# Patient Record
Sex: Male | Born: 1935 | Race: White | Hispanic: No | Marital: Married
Health system: Southern US, Community
[De-identification: ages and names within clinical notes are randomized; demographics above are authoritative.]

---

## 2004-08-16 ENCOUNTER — Ambulatory Visit: Payer: Self-pay | Admitting: Gastroenterology

## 2005-09-25 ENCOUNTER — Ambulatory Visit: Payer: Self-pay | Admitting: Ophthalmology

## 2005-11-06 ENCOUNTER — Ambulatory Visit: Payer: Self-pay | Admitting: Ophthalmology

## 2008-05-12 ENCOUNTER — Ambulatory Visit: Payer: Self-pay | Admitting: Oncology

## 2008-05-27 ENCOUNTER — Ambulatory Visit: Payer: Self-pay | Admitting: Oncology

## 2008-06-12 ENCOUNTER — Ambulatory Visit: Payer: Self-pay | Admitting: Oncology

## 2008-07-13 ENCOUNTER — Ambulatory Visit: Payer: Self-pay | Admitting: Oncology

## 2012-02-16 ENCOUNTER — Emergency Department: Payer: Self-pay | Admitting: Emergency Medicine

## 2012-02-16 LAB — TROPONIN I: Troponin-I: <0.02 ng/mL

## 2012-02-16 LAB — COMPREHENSIVE METABOLIC PANEL
Albumin: 3.5 g/dL (ref 3.4–5.0)
Anion Gap: 8 (ref 7–16)
BUN: 21 mg/dL — ABNORMAL HIGH (ref 7–18)
Bilirubin,Total: 1.1 mg/dL — ABNORMAL HIGH (ref 0.2–1.0)
Chloride: 104 mmol/L (ref 98–107)
Creatinine: 0.74 mg/dL (ref 0.60–1.30)
EGFR (African American): >60
Osmolality: 281 (ref 275–301)
Potassium: 4.3 mmol/L (ref 3.5–5.1)
SGOT(AST): 38 U/L — ABNORMAL HIGH (ref 15–37)
SGPT (ALT): 26 U/L (ref 12–78)
Sodium: 139 mmol/L (ref 136–145)
Total Protein: 7.5 g/dL (ref 6.4–8.2)

## 2012-02-16 LAB — URINALYSIS, COMPLETE
Bilirubin,UR: NEGATIVE
Glucose,UR: NEGATIVE mg/dL (ref 0–75)
Nitrite: NEGATIVE
RBC,UR: 5 /HPF (ref 0–5)
WBC UR: 1 /HPF (ref 0–5)

## 2012-02-16 LAB — CBC
HCT: 37 % — ABNORMAL LOW (ref 40.0–52.0)
MCV: 101 fL — ABNORMAL HIGH (ref 80–100)
WBC: 8.9 10*3/uL (ref 3.8–10.6)

## 2012-02-16 LAB — CK TOTAL AND CKMB (NOT AT ARMC): CK-MB: 1.2 ng/mL (ref 0.5–3.6)

## 2012-02-29 ENCOUNTER — Inpatient Hospital Stay: Payer: Self-pay | Admitting: Internal Medicine

## 2012-02-29 LAB — COMPREHENSIVE METABOLIC PANEL
Albumin: 3.4 g/dL (ref 3.4–5.0)
Alkaline Phosphatase: 125 U/L (ref 50–136)
Anion Gap: 7 (ref 7–16)
BUN: 20 mg/dL — ABNORMAL HIGH (ref 7–18)
Bilirubin,Total: 0.7 mg/dL (ref 0.2–1.0)
Calcium, Total: 9.1 mg/dL (ref 8.5–10.1)
EGFR (African American): >60
EGFR (Non-African Amer.): >60
Glucose: 126 mg/dL — ABNORMAL HIGH (ref 65–99)
Osmolality: 291 (ref 275–301)
Potassium: 3.7 mmol/L (ref 3.5–5.1)
SGOT(AST): 23 U/L (ref 15–37)
SGPT (ALT): 22 U/L (ref 12–78)
Sodium: 144 mmol/L (ref 136–145)

## 2012-02-29 LAB — CBC
HCT: 34.6 % — ABNORMAL LOW (ref 40.0–52.0)
HGB: 11.7 g/dL — ABNORMAL LOW (ref 13.0–18.0)
MCH: 33.8 pg (ref 26.0–34.0)
MCHC: 34 g/dL (ref 32.0–36.0)
MCV: 100 fL (ref 80–100)
RBC: 3.47 10*6/uL — ABNORMAL LOW (ref 4.40–5.90)

## 2012-02-29 LAB — URINALYSIS, COMPLETE
Bacteria: NONE SEEN
Bilirubin,UR: NEGATIVE
Glucose,UR: NEGATIVE mg/dL (ref 0–75)
Leukocyte Esterase: NEGATIVE
Nitrite: NEGATIVE
Protein: NEGATIVE
Squamous Epithelial: <1
WBC UR: <1 /HPF (ref 0–5)

## 2012-02-29 LAB — TROPONIN I: Troponin-I: <0.02 ng/mL

## 2012-03-01 LAB — CBC WITH DIFFERENTIAL/PLATELET
Basophil %: 0.6 %
Eosinophil %: 3.1 %
HGB: 11.4 g/dL — ABNORMAL LOW (ref 13.0–18.0)
Lymphocyte #: 1.4 10*3/uL (ref 1.0–3.6)
Lymphocyte %: 23.1 %
MCV: 100 fL (ref 80–100)
Monocyte #: 0.6 x10 3/mm (ref 0.2–1.0)
Monocyte %: 10.7 %
Neutrophil %: 62.5 %
Platelet: 165 10*3/uL (ref 150–440)
RBC: 3.36 10*6/uL — ABNORMAL LOW (ref 4.40–5.90)
WBC: 5.9 10*3/uL (ref 3.8–10.6)

## 2012-03-01 LAB — TSH: Thyroid Stimulating Horm: 5.63 u[IU]/mL — ABNORMAL HIGH

## 2012-03-01 LAB — COMPREHENSIVE METABOLIC PANEL
Anion Gap: 8 (ref 7–16)
BUN: 14 mg/dL (ref 7–18)
Calcium, Total: 8.6 mg/dL (ref 8.5–10.1)
Chloride: 109 mmol/L — ABNORMAL HIGH (ref 98–107)
Co2: 28 mmol/L (ref 21–32)
EGFR (African American): >60
EGFR (Non-African Amer.): >60
Glucose: 66 mg/dL (ref 65–99)
SGOT(AST): 22 U/L (ref 15–37)
SGPT (ALT): 20 U/L (ref 12–78)

## 2012-03-02 LAB — CBC WITH DIFFERENTIAL/PLATELET
Basophil #: 0 10*3/uL (ref 0.0–0.1)
Eosinophil %: 3.7 %
HCT: 29.8 % — ABNORMAL LOW (ref 40.0–52.0)
HGB: 10.1 g/dL — ABNORMAL LOW (ref 13.0–18.0)
Lymphocyte #: 1.1 10*3/uL (ref 1.0–3.6)
MCH: 33.7 pg (ref 26.0–34.0)
MCHC: 34 g/dL (ref 32.0–36.0)
MCV: 99 fL (ref 80–100)
Monocyte #: 0.5 x10 3/mm (ref 0.2–1.0)
Monocyte %: 10.4 %
Neutrophil #: 3.3 10*3/uL (ref 1.4–6.5)
RDW: 13.4 % (ref 11.5–14.5)

## 2012-03-02 LAB — BASIC METABOLIC PANEL
Calcium, Total: 7.8 mg/dL — ABNORMAL LOW (ref 8.5–10.1)
Chloride: 111 mmol/L — ABNORMAL HIGH (ref 98–107)
Co2: 26 mmol/L (ref 21–32)
Creatinine: 0.76 mg/dL (ref 0.60–1.30)
EGFR (African American): >60
EGFR (Non-African Amer.): >60
Potassium: 3.3 mmol/L — ABNORMAL LOW (ref 3.5–5.1)
Sodium: 147 mmol/L — ABNORMAL HIGH (ref 136–145)

## 2012-03-03 LAB — BASIC METABOLIC PANEL
Anion Gap: 8 (ref 7–16)
Calcium, Total: 7.8 mg/dL — ABNORMAL LOW (ref 8.5–10.1)
Co2: 26 mmol/L (ref 21–32)
Creatinine: 0.75 mg/dL (ref 0.60–1.30)
EGFR (African American): >60
EGFR (Non-African Amer.): >60
Potassium: 3.9 mmol/L (ref 3.5–5.1)
Sodium: 145 mmol/L (ref 136–145)

## 2012-03-03 LAB — CBC WITH DIFFERENTIAL/PLATELET
Basophil %: 1.1 %
Eosinophil %: 4.1 %
HCT: 29.3 % — ABNORMAL LOW (ref 40.0–52.0)
HGB: 10.1 g/dL — ABNORMAL LOW (ref 13.0–18.0)
Lymphocyte #: 1.4 10*3/uL (ref 1.0–3.6)
Lymphocyte %: 23.5 %
MCHC: 34.4 g/dL (ref 32.0–36.0)
Monocyte #: 0.6 x10 3/mm (ref 0.2–1.0)
Monocyte %: 9.8 %
Neutrophil #: 3.6 10*3/uL (ref 1.4–6.5)
Neutrophil %: 61.5 %
Platelet: 137 10*3/uL — ABNORMAL LOW (ref 150–440)
RBC: 2.97 10*6/uL — ABNORMAL LOW (ref 4.40–5.90)
WBC: 5.8 10*3/uL (ref 3.8–10.6)

## 2012-03-14 ENCOUNTER — Ambulatory Visit: Payer: Self-pay | Admitting: Internal Medicine

## 2012-03-14 ENCOUNTER — Inpatient Hospital Stay: Payer: Self-pay | Admitting: Internal Medicine

## 2012-03-14 LAB — COMPREHENSIVE METABOLIC PANEL
Albumin: 2.2 g/dL — ABNORMAL LOW (ref 3.4–5.0)
Anion Gap: 11 (ref 7–16)
Calcium, Total: 8.3 mg/dL — ABNORMAL LOW (ref 8.5–10.1)
Chloride: 111 mmol/L — ABNORMAL HIGH (ref 98–107)
Co2: 26 mmol/L (ref 21–32)
Creatinine: 1.29 mg/dL (ref 0.60–1.30)
EGFR (African American): 57 — ABNORMAL LOW
EGFR (Non-African Amer.): 50 — ABNORMAL LOW
Osmolality: 298 (ref 275–301)
Potassium: 3.5 mmol/L (ref 3.5–5.1)

## 2012-03-14 LAB — PROTIME-INR
INR: 1.5
Prothrombin Time: 18.7 secs — ABNORMAL HIGH (ref 11.5–14.7)

## 2012-03-14 LAB — CBC WITH DIFFERENTIAL/PLATELET
Bands: 33 %
Comment - H1-Com1: NORMAL
HCT: 34.1 % — ABNORMAL LOW (ref 40.0–52.0)
HGB: 11.4 g/dL — ABNORMAL LOW (ref 13.0–18.0)
Lymphocytes: 6 %
MCH: 33.4 pg (ref 26.0–34.0)
MCHC: 33.6 g/dL (ref 32.0–36.0)
Segmented Neutrophils: 58 %
WBC: 7.4 10*3/uL (ref 3.8–10.6)

## 2012-03-15 LAB — BASIC METABOLIC PANEL
Chloride: 111 mmol/L — ABNORMAL HIGH (ref 98–107)
EGFR (African American): >60
EGFR (Non-African Amer.): >60
Glucose: 152 mg/dL — ABNORMAL HIGH (ref 65–99)
Potassium: 3.5 mmol/L (ref 3.5–5.1)

## 2012-03-15 LAB — CBC WITH DIFFERENTIAL/PLATELET
Basophil #: 0 10*3/uL (ref 0.0–0.1)
Basophil %: 0.1 %
Eosinophil #: 0 10*3/uL (ref 0.0–0.7)
HGB: 9.9 g/dL — ABNORMAL LOW (ref 13.0–18.0)
Lymphocyte %: 2.1 %
MCH: 32.4 pg (ref 26.0–34.0)
MCHC: 32.4 g/dL (ref 32.0–36.0)
Monocyte #: 0.3 x10 3/mm (ref 0.2–1.0)
Monocyte %: 1.5 %
Neutrophil %: 96.3 %
Platelet: 182 10*3/uL (ref 150–440)
RDW: 14 % (ref 11.5–14.5)

## 2012-03-16 LAB — CBC WITH DIFFERENTIAL/PLATELET
Basophil #: 0 10*3/uL (ref 0.0–0.1)
Basophil %: 0.1 %
Eosinophil #: 0 10*3/uL (ref 0.0–0.7)
Eosinophil %: 0 %
HCT: 29.6 % — ABNORMAL LOW (ref 40.0–52.0)
HGB: 9.6 g/dL — ABNORMAL LOW (ref 13.0–18.0)
MCH: 32.4 pg (ref 26.0–34.0)
MCHC: 32.5 g/dL (ref 32.0–36.0)
MCV: 100 fL (ref 80–100)
Monocyte #: 0.4 x10 3/mm (ref 0.2–1.0)
Neutrophil %: 97 %
Platelet: 236 10*3/uL (ref 150–440)
RBC: 2.97 10*6/uL — ABNORMAL LOW (ref 4.40–5.90)
RDW: 14.1 % (ref 11.5–14.5)
WBC: 27 10*3/uL — ABNORMAL HIGH (ref 3.8–10.6)

## 2012-03-16 LAB — BASIC METABOLIC PANEL
Anion Gap: 12 (ref 7–16)
BUN: 30 mg/dL — ABNORMAL HIGH (ref 7–18)
Calcium, Total: 8.2 mg/dL — ABNORMAL LOW (ref 8.5–10.1)
Chloride: 111 mmol/L — ABNORMAL HIGH (ref 98–107)
Co2: 24 mmol/L (ref 21–32)
Creatinine: 0.89 mg/dL (ref 0.60–1.30)
EGFR (Non-African Amer.): >60
Sodium: 147 mmol/L — ABNORMAL HIGH (ref 136–145)

## 2012-03-17 LAB — CBC WITH DIFFERENTIAL/PLATELET
Basophil %: 0 %
Eosinophil #: 0 10*3/uL (ref 0.0–0.7)
Eosinophil %: 0 %
HGB: 8.3 g/dL — ABNORMAL LOW (ref 13.0–18.0)
Lymphocyte #: 0.4 10*3/uL — ABNORMAL LOW (ref 1.0–3.6)
Lymphocyte %: 2.2 %
MCH: 32.8 pg (ref 26.0–34.0)
Monocyte #: 0.3 x10 3/mm (ref 0.2–1.0)
Neutrophil %: 96.4 %
Platelet: 219 10*3/uL (ref 150–440)
RBC: 2.55 10*6/uL — ABNORMAL LOW (ref 4.40–5.90)
WBC: 18.5 10*3/uL — ABNORMAL HIGH (ref 3.8–10.6)

## 2012-03-17 LAB — CREATININE, SERUM
EGFR (African American): >60
EGFR (Non-African Amer.): >60

## 2012-03-18 LAB — VANCOMYCIN, TROUGH: Vancomycin, Trough: 16 ug/mL (ref 10–20)

## 2012-03-18 LAB — BASIC METABOLIC PANEL
Anion Gap: 11 (ref 7–16)
BUN: 35 mg/dL — ABNORMAL HIGH (ref 7–18)
Calcium, Total: 7.9 mg/dL — ABNORMAL LOW (ref 8.5–10.1)
Chloride: 109 mmol/L — ABNORMAL HIGH (ref 98–107)
Co2: 24 mmol/L (ref 21–32)
Creatinine: 0.92 mg/dL (ref 0.60–1.30)
EGFR (African American): >60
Osmolality: 297 (ref 275–301)

## 2012-03-18 LAB — TRIGLYCERIDES: Triglycerides: 127 mg/dL (ref 0–200)

## 2012-03-19 LAB — CBC WITH DIFFERENTIAL/PLATELET
Basophil #: 0 10*3/uL (ref 0.0–0.1)
Basophil %: 0.1 %
Eosinophil #: 0 10*3/uL (ref 0.0–0.7)
Lymphocyte #: 0.3 10*3/uL — ABNORMAL LOW (ref 1.0–3.6)
MCH: 33.1 pg (ref 26.0–34.0)
MCHC: 33.6 g/dL (ref 32.0–36.0)
MCV: 99 fL (ref 80–100)
Monocyte #: 0.3 x10 3/mm (ref 0.2–1.0)
Neutrophil %: 94.9 %
Platelet: 254 10*3/uL (ref 150–440)
RDW: 13.8 % (ref 11.5–14.5)

## 2012-03-19 LAB — BASIC METABOLIC PANEL
BUN: 33 mg/dL — ABNORMAL HIGH (ref 7–18)
Chloride: 109 mmol/L — ABNORMAL HIGH (ref 98–107)
Co2: 24 mmol/L (ref 21–32)
EGFR (African American): >60
EGFR (Non-African Amer.): >60
Osmolality: 297 (ref 275–301)
Potassium: 3.2 mmol/L — ABNORMAL LOW (ref 3.5–5.1)

## 2012-03-19 LAB — CULTURE, BLOOD (SINGLE)

## 2012-03-19 LAB — PHOSPHORUS: Phosphorus: 2.9 mg/dL (ref 2.5–4.9)

## 2012-03-20 LAB — BASIC METABOLIC PANEL
Anion Gap: 10 (ref 7–16)
Calcium, Total: 7.9 mg/dL — ABNORMAL LOW (ref 8.5–10.1)
Co2: 24 mmol/L (ref 21–32)
EGFR (African American): >60
EGFR (Non-African Amer.): >60
Osmolality: 291 (ref 275–301)
Sodium: 139 mmol/L (ref 136–145)

## 2012-03-20 LAB — CBC WITH DIFFERENTIAL/PLATELET
Basophil %: 0 %
Eosinophil #: 0 10*3/uL (ref 0.0–0.7)
HGB: 10 g/dL — ABNORMAL LOW (ref 13.0–18.0)
Lymphocyte %: 3.2 %
MCHC: 33.7 g/dL (ref 32.0–36.0)
Monocyte #: 0.4 x10 3/mm (ref 0.2–1.0)
Neutrophil %: 93.2 %
RBC: 3.02 10*6/uL — ABNORMAL LOW (ref 4.40–5.90)
WBC: 10.6 10*3/uL (ref 3.8–10.6)

## 2012-03-21 LAB — BASIC METABOLIC PANEL
BUN: 34 mg/dL — ABNORMAL HIGH (ref 7–18)
Calcium, Total: 7.6 mg/dL — ABNORMAL LOW (ref 8.5–10.1)
Co2: 25 mmol/L (ref 21–32)
Osmolality: 292 (ref 275–301)
Potassium: 3.6 mmol/L (ref 3.5–5.1)
Sodium: 139 mmol/L (ref 136–145)

## 2012-03-21 LAB — CBC WITH DIFFERENTIAL/PLATELET
Basophil %: 0.1 %
Eosinophil %: 0 %
HGB: 9.7 g/dL — ABNORMAL LOW (ref 13.0–18.0)
Lymphocyte #: 0.3 10*3/uL — ABNORMAL LOW (ref 1.0–3.6)
MCH: 34.6 pg — ABNORMAL HIGH (ref 26.0–34.0)
MCHC: 35.4 g/dL (ref 32.0–36.0)
Monocyte %: 4.5 %
Platelet: 232 10*3/uL (ref 150–440)
WBC: 8.5 10*3/uL (ref 3.8–10.6)

## 2012-03-22 LAB — CBC WITH DIFFERENTIAL/PLATELET
Basophil #: 0 10*3/uL (ref 0.0–0.1)
Eosinophil #: 0 10*3/uL (ref 0.0–0.7)
HCT: 28.6 % — ABNORMAL LOW (ref 40.0–52.0)
Lymphocyte #: 0.2 10*3/uL — ABNORMAL LOW (ref 1.0–3.6)
MCHC: 34.4 g/dL (ref 32.0–36.0)
MCV: 97 fL (ref 80–100)
Monocyte #: 0.4 x10 3/mm (ref 0.2–1.0)
Neutrophil #: 7.9 10*3/uL — ABNORMAL HIGH (ref 1.4–6.5)
Platelet: 225 10*3/uL (ref 150–440)
RDW: 14.1 % (ref 11.5–14.5)
WBC: 8.5 10*3/uL (ref 3.8–10.6)

## 2012-03-23 LAB — CBC WITH DIFFERENTIAL/PLATELET
Basophil #: 0 10*3/uL (ref 0.0–0.1)
Basophil %: 0.4 %
Eosinophil #: 0 10*3/uL (ref 0.0–0.7)
HCT: 28.8 % — ABNORMAL LOW (ref 40.0–52.0)
Lymphocyte #: 0.8 10*3/uL — ABNORMAL LOW (ref 1.0–3.6)
MCH: 33.6 pg (ref 26.0–34.0)
MCHC: 34.7 g/dL (ref 32.0–36.0)
Monocyte #: 0.6 x10 3/mm (ref 0.2–1.0)
Monocyte %: 6.6 %
Neutrophil %: 84 %
Platelet: 257 10*3/uL (ref 150–440)

## 2012-03-23 LAB — BASIC METABOLIC PANEL
Calcium, Total: 8 mg/dL — ABNORMAL LOW (ref 8.5–10.1)
Chloride: 107 mmol/L (ref 98–107)
Co2: 26 mmol/L (ref 21–32)
Creatinine: 0.74 mg/dL (ref 0.60–1.30)
EGFR (Non-African Amer.): >60
Osmolality: 292 (ref 275–301)
Potassium: 3.3 mmol/L — ABNORMAL LOW (ref 3.5–5.1)
Sodium: 142 mmol/L (ref 136–145)

## 2012-03-24 LAB — BASIC METABOLIC PANEL
Anion Gap: 10 (ref 7–16)
BUN: 34 mg/dL — ABNORMAL HIGH (ref 7–18)
Calcium, Total: 7.8 mg/dL — ABNORMAL LOW (ref 8.5–10.1)
Chloride: 112 mmol/L — ABNORMAL HIGH (ref 98–107)
Co2: 25 mmol/L (ref 21–32)
Creatinine: 0.72 mg/dL (ref 0.60–1.30)
EGFR (African American): >60
Osmolality: 302 (ref 275–301)

## 2012-03-24 LAB — CBC WITH DIFFERENTIAL/PLATELET
Basophil #: 0 10*3/uL (ref 0.0–0.1)
Basophil %: 0.2 %
Eosinophil %: 0 %
HCT: 28.2 % — ABNORMAL LOW (ref 40.0–52.0)
HGB: 9.5 g/dL — ABNORMAL LOW (ref 13.0–18.0)
Lymphocyte %: 6.4 %
MCHC: 33.7 g/dL (ref 32.0–36.0)
Monocyte %: 4.6 %
Neutrophil #: 7.3 10*3/uL — ABNORMAL HIGH (ref 1.4–6.5)
Neutrophil %: 88.8 %
Platelet: 241 10*3/uL (ref 150–440)
RBC: 2.9 10*6/uL — ABNORMAL LOW (ref 4.40–5.90)
RDW: 14.5 % (ref 11.5–14.5)
WBC: 8.2 10*3/uL (ref 3.8–10.6)

## 2012-03-25 LAB — BASIC METABOLIC PANEL
Anion Gap: 11 (ref 7–16)
BUN: 29 mg/dL — ABNORMAL HIGH (ref 7–18)
Creatinine: 0.63 mg/dL (ref 0.60–1.30)
EGFR (Non-African Amer.): >60
Glucose: 108 mg/dL — ABNORMAL HIGH (ref 65–99)
Potassium: 3.4 mmol/L — ABNORMAL LOW (ref 3.5–5.1)
Sodium: 149 mmol/L — ABNORMAL HIGH (ref 136–145)

## 2012-03-25 LAB — CBC WITH DIFFERENTIAL/PLATELET
Basophil %: 0.1 %
Eosinophil #: 0 10*3/uL (ref 0.0–0.7)
Eosinophil %: 0.1 %
HCT: 28.5 % — ABNORMAL LOW (ref 40.0–52.0)
HGB: 9.4 g/dL — ABNORMAL LOW (ref 13.0–18.0)
Lymphocyte #: 0.6 10*3/uL — ABNORMAL LOW (ref 1.0–3.6)
Lymphocyte %: 4.7 %
MCH: 32.2 pg (ref 26.0–34.0)
MCHC: 32.9 g/dL (ref 32.0–36.0)
MCV: 98 fL (ref 80–100)
Monocyte #: 0.6 x10 3/mm (ref 0.2–1.0)
Monocyte %: 4.7 %
Neutrophil #: 10.9 10*3/uL — ABNORMAL HIGH (ref 1.4–6.5)
Neutrophil %: 90.4 %
RDW: 14.8 % — ABNORMAL HIGH (ref 11.5–14.5)

## 2012-03-27 LAB — CBC WITH DIFFERENTIAL/PLATELET
Basophil %: 0.4 %
Eosinophil #: 0 10*3/uL (ref 0.0–0.7)
Eosinophil %: 0.2 %
HGB: 8.5 g/dL — ABNORMAL LOW (ref 13.0–18.0)
Lymphocyte %: 6.6 %
MCH: 33.9 pg (ref 26.0–34.0)
MCHC: 33.9 g/dL (ref 32.0–36.0)
MCV: 100 fL (ref 80–100)
Monocyte #: 0.6 x10 3/mm (ref 0.2–1.0)
Neutrophil %: 86.2 %
Platelet: 138 10*3/uL — ABNORMAL LOW (ref 150–440)
RBC: 2.51 10*6/uL — ABNORMAL LOW (ref 4.40–5.90)

## 2012-03-27 LAB — BASIC METABOLIC PANEL
Anion Gap: 9 (ref 7–16)
Calcium, Total: 7.6 mg/dL — ABNORMAL LOW (ref 8.5–10.1)
Co2: 25 mmol/L (ref 21–32)
Creatinine: 0.63 mg/dL (ref 0.60–1.30)
EGFR (African American): >60
Potassium: 3.9 mmol/L (ref 3.5–5.1)
Sodium: 155 mmol/L — ABNORMAL HIGH (ref 136–145)

## 2012-03-28 LAB — BASIC METABOLIC PANEL
Anion Gap: 9 (ref 7–16)
BUN: 22 mg/dL — ABNORMAL HIGH (ref 7–18)
Creatinine: 0.61 mg/dL (ref 0.60–1.30)
EGFR (African American): >60
EGFR (Non-African Amer.): >60
Glucose: 112 mg/dL — ABNORMAL HIGH (ref 65–99)
Sodium: 155 mmol/L — ABNORMAL HIGH (ref 136–145)

## 2012-04-12 ENCOUNTER — Ambulatory Visit: Payer: Self-pay | Admitting: Internal Medicine

## 2012-04-12 DEATH — deceased

## 2013-05-02 IMAGING — CR DG CHEST 1V PORT
1 series · 1 of 1 positions shown · non-contrast
Comparison: none

REASON FOR EXAM: fever, cough, hypoxia
COMMENTS:

[ap]
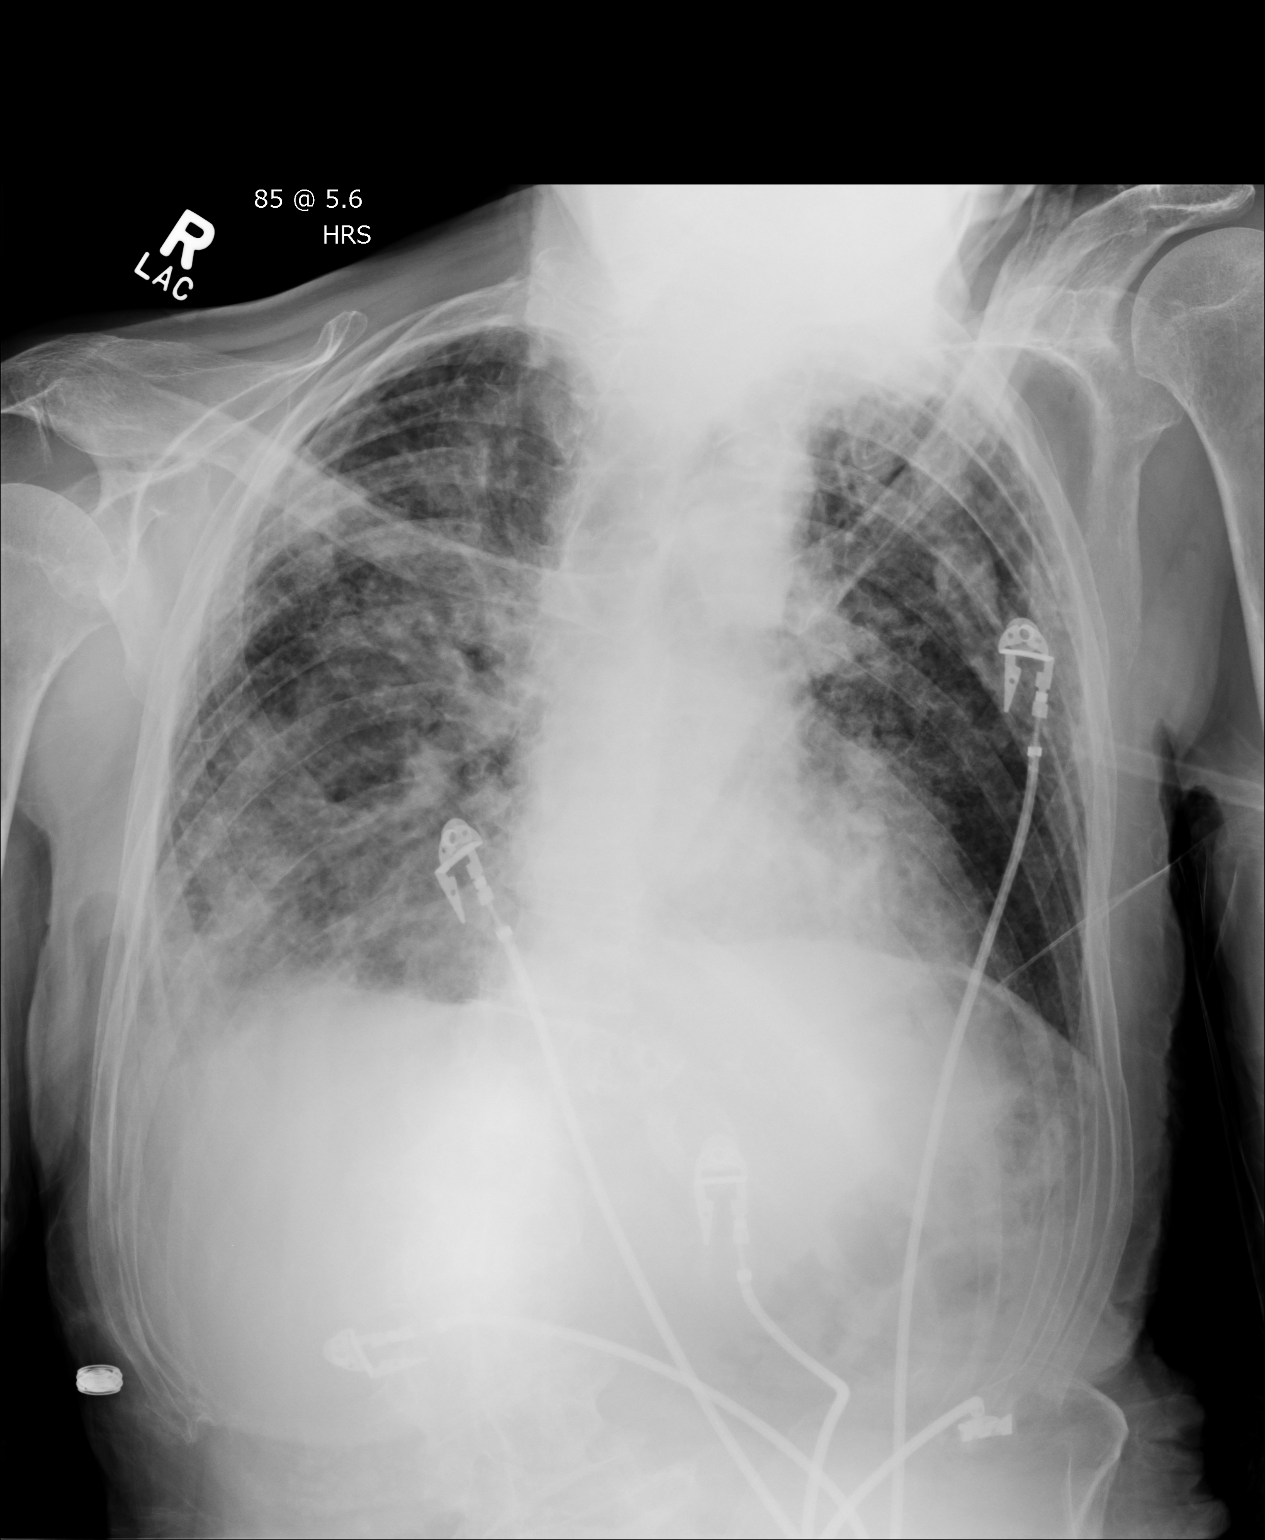

[1 of 1 positions shown; findings below may reference images not displayed]

PROCEDURE:     DXR - DXR PORTABLE CHEST SINGLE VIEW  - March 14, 2012  [DATE]

RESULT:     Comparison is made to the study 29 February, 2012.

The lungs are adequately inflated. Near confluent increased interstitial
markings are present on the right with milder increased lung markings on the
left. These findings are not new. The cardiac silhouette is not enlarged.
The pulmonary vascularity is indistinct. There may be a small amount of
pleural fluid on the right.
IMPRESSION: The findings are worrisome for bilateral interstitial
pneumonia or less likely interstitial edema. There is no overt evidence of
pulmonary vascular congestion or cardiac chamber enlargement. A followup PA
and lateral chest x-ray would be of value.

[REDACTED]

## 2013-05-03 IMAGING — CR DG CHEST 1V PORT
1 series · 1 of 1 positions shown · non-contrast
Comparison: none

REASON FOR EXAM: Respiratory Failure/Interval Changes
COMMENTS:

[portable]
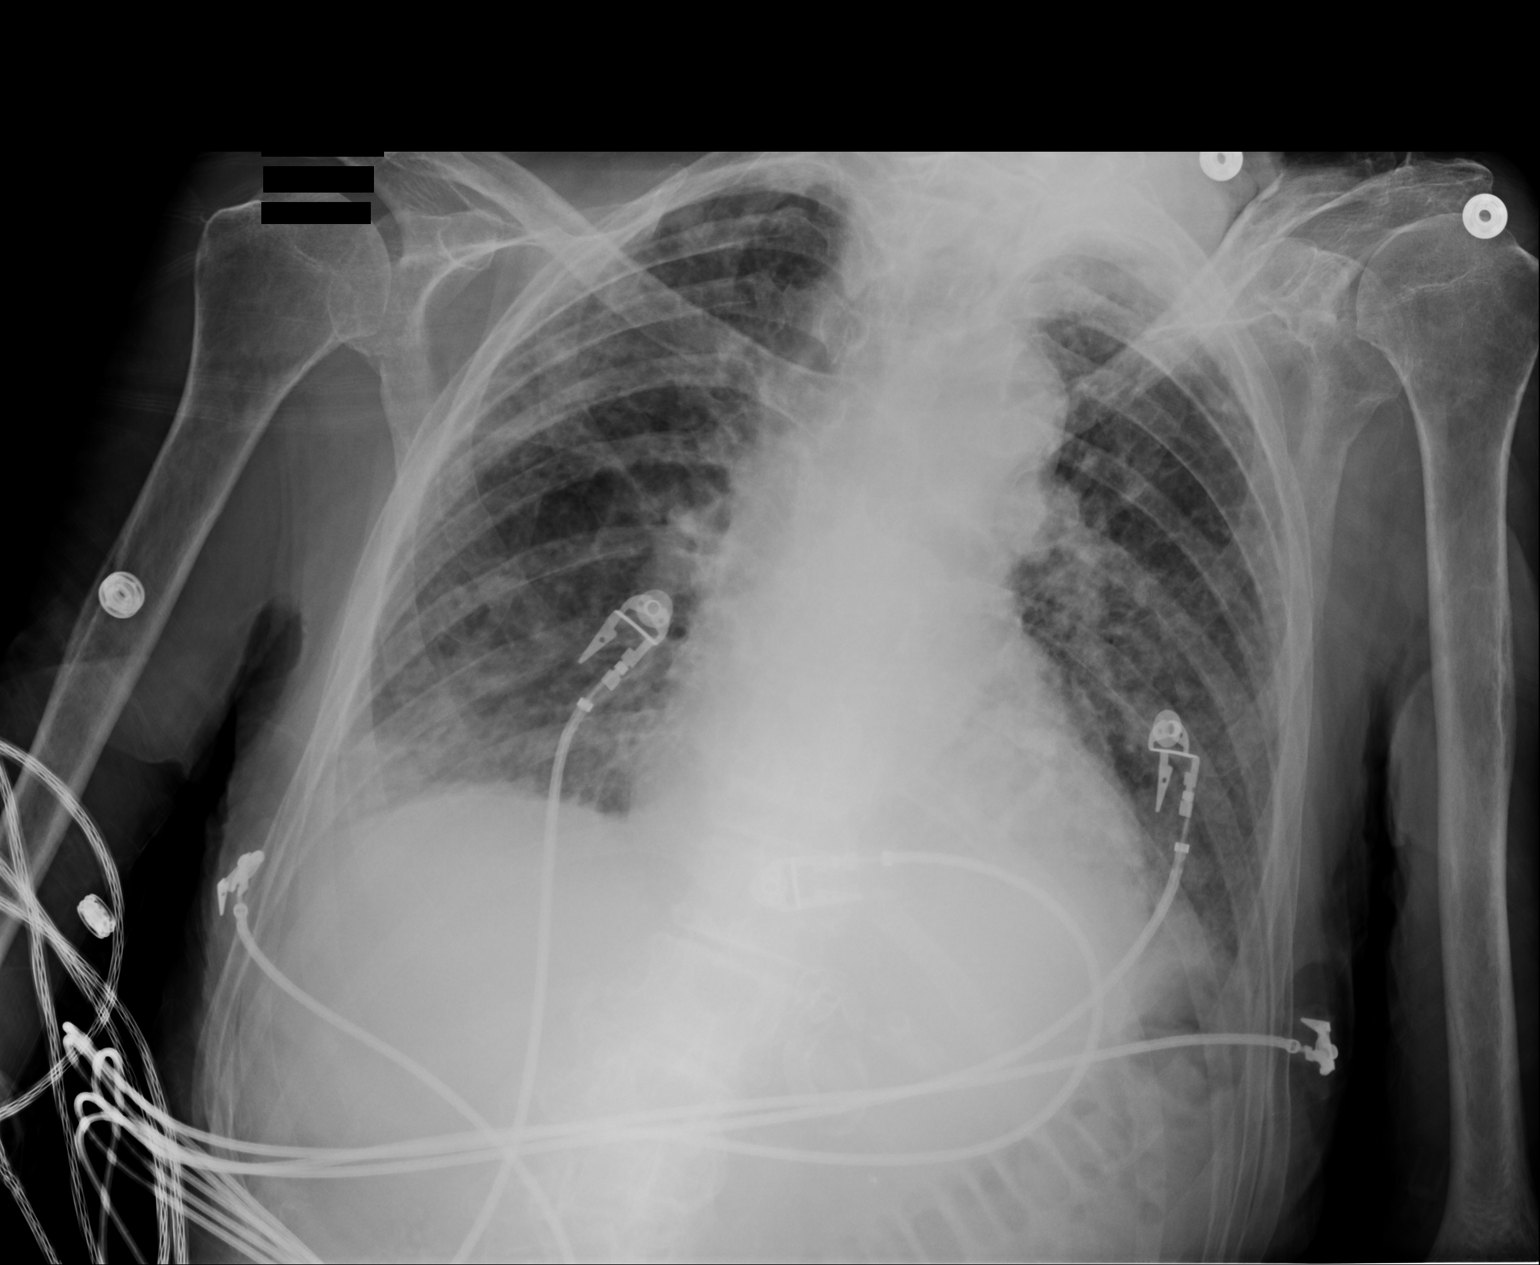

[1 of 1 positions shown; findings below may reference images not displayed]

PROCEDURE:     DXR - DXR PORTABLE CHEST SINGLE VIEW  - March 15, 2012  [DATE]

RESULT:     Comparison made to prior study of 03/14/2012 . There is
persistent bilateral interstitial prominence is slightly improved from prior
study. Mild cardiomegaly. Pulmonary vascularity is normal. These findings
suggest diffuse bilateral interstitial pneumonia. Interstitial edema cannot
be excluded.
IMPRESSION: Persistent bilateral  interstitial lung disease most likely
interstitial pneumonitis. These changes have improved slightly from
03/14/2012.

## 2013-05-04 IMAGING — CR DG CHEST 1V PORT
1 series · 1 of 1 positions shown · non-contrast
Comparison: none

REASON FOR EXAM: Respiratory Failure/Interval Changes
COMMENTS:

[ap]
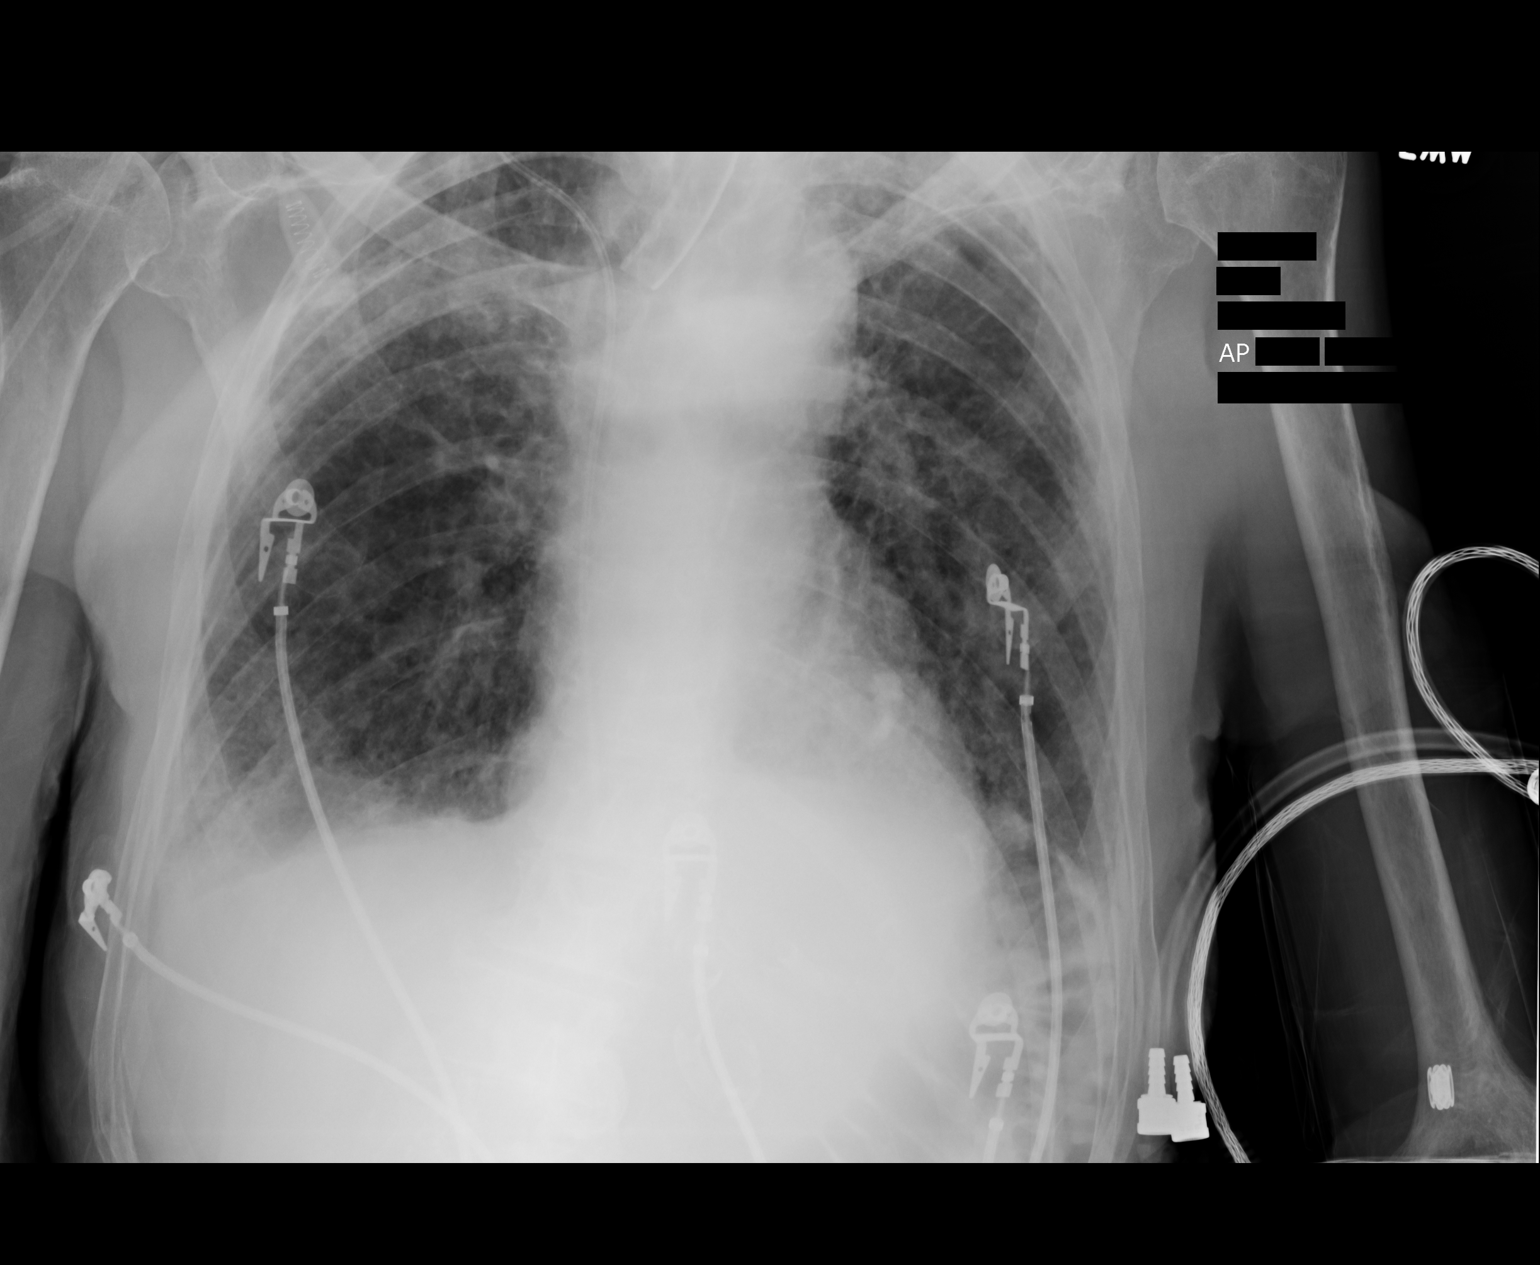

[1 of 1 positions shown; findings below may reference images not displayed]

PROCEDURE:     DXR - DXR PORTABLE CHEST SINGLE VIEW  - March 16, 2012  [DATE]

RESULT:     Comparison made to prior study 03/15/2012 and 02/29/2012. The
tracheostomy tube in good position. Central line good position. Bilateral
pulmonary interstitial changes again noted and are  unchanged. Similar
findings have been noted on multiple prior exams. Poor inspiratory effort
with atelectasis in lung bases. Left ventricular wall versus mitral annular
calcific noted. This is stable.
IMPRESSION: 1. Stable interstitial lung disease, possibly interstitial fibrosis.
2. Stable tube positions.

## 2013-05-06 IMAGING — CR DG CHEST 1V PORT
1 series · 1 of 1 positions shown · non-contrast
Comparison: none

REASON FOR EXAM: placement for et tube
COMMENTS:

PROCEDURE:     DXR - DXR PORTABLE CHEST SINGLE VIEW  - March 18, 2012  [DATE]
RESULT:     Comparison: 03/16/2012

[ap]
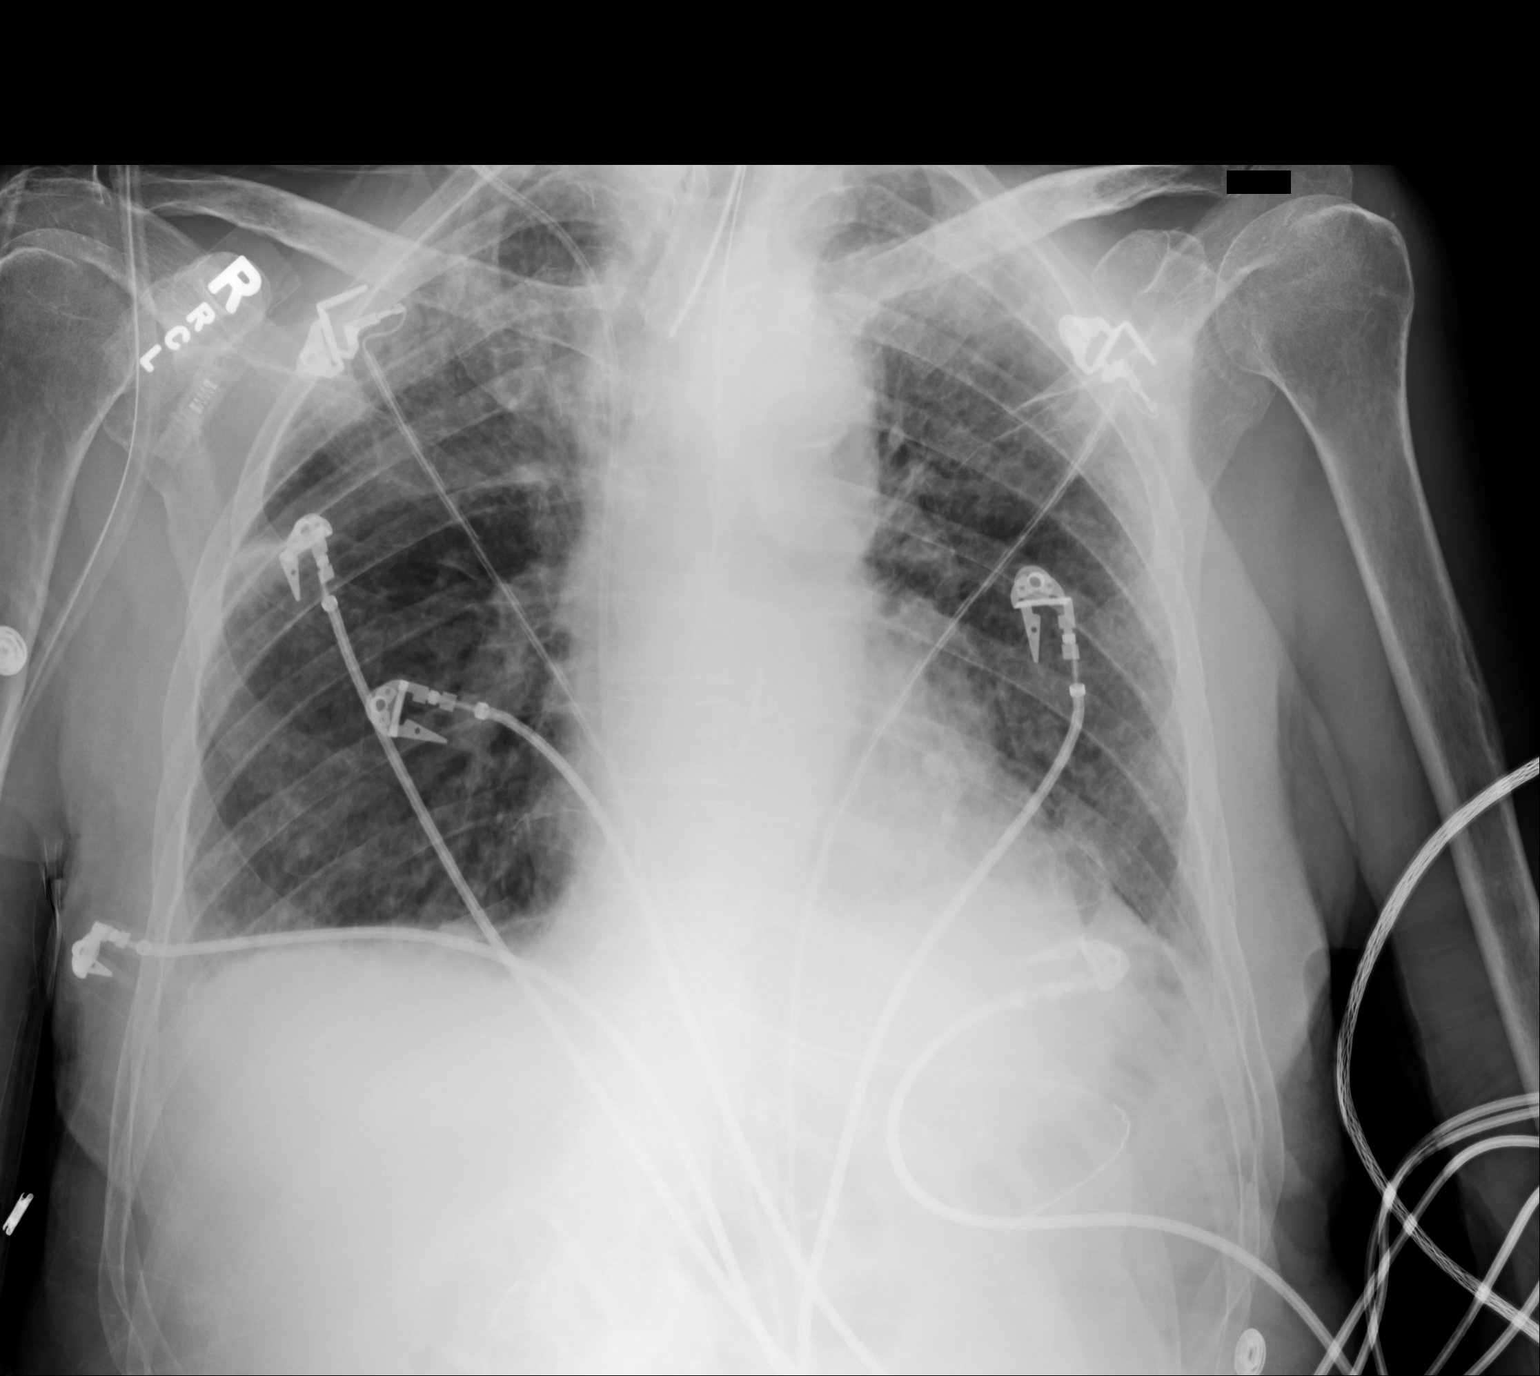

[1 of 1 positions shown; findings below may reference images not displayed]

FINDINGS: The endotracheal tube terminates at or just below the thoracic inlet. Right
IJ central venous catheter tip is difficult to evaluate secondary to the
overlying leads. However, it likely terminates overlying the right atrium.
Enteric tube terminates over the left upper quadrant. The heart and
mediastinal are stable. Bilateral heterogeneous interstitial opacities are
similar to prior.
IMPRESSION: 1. Endotracheal tube terminates at or just below the thoracic inlet.
2. Bilateral heterogeneous reticular opacities are similar to prior.

[REDACTED]

## 2014-09-29 NOTE — Consult Note (Signed)
Comments   I met with pt's wife, son and daughter. Updated them on pt's current medical condition. Explained that when pt passes breathing trial, we will extubate. Discussed at length what wife would choose for pt in the event he does not do well after extubation, eg reintubation vs comfort care. Wife stated that she herself would not want to be maintained on "life support" and doubts that her husband would either. However, family wants to discuss further among themselves and will get back to me.  expressed appreciation for meeting. All questions answered.   Electronic Signatures: Tiari Andringa, Izora Gala (MD)  (Signed 09-Oct-13 17:07)  Authored: Palliative Care   Last Updated: 09-Oct-13 17:07 by Ancel Easler, Izora Gala (MD)

## 2014-09-29 NOTE — Consult Note (Signed)
Brief Consult Note: Diagnosis: frequent falls, ? post concussion gait impairment?.   Patient was seen by consultant.   Consult note dictated.   Comments: very elderly, cachectic WM had severe fall with orbital fracture - had gait impirment since then. - no s/s of parkinsonism or appendicular ataxia. - can't rule out truncal ataxia - possible myelopathy with hyper-reflexia but will not pursue MR c-spine as he won't be a good candidate for any surgery. - agree with PT, home health. will follow PRN.  Electronic Signatures: Jolene ProvostShah, Ketra Duchesne Kalpeshkumar (MD)  (Signed 21-Sep-13 18:43)  Authored: Brief Consult Note   Last Updated: 21-Sep-13 18:43 by Jolene ProvostShah, Levenia Skalicky Kalpeshkumar (MD)

## 2014-09-29 NOTE — H&P (Signed)
PATIENT NAMCharletta Colon:  Mineer, Masaji L MR#:  [id number] DATE OF BIRTH:  April 27, 1923  DATE OF ADMISSION:  02/29/2012  HISTORY OF PRESENT ILLNESS: He is an 81 year old male presents with acute CVA. He was found to have significant disequilibrium with multiple falls approximately three weeks ago and sustained a right orbital fracture with some bruising of his thoracic spine. He went home and has had progressive problems and multiple falls. He can stand up, start to ambulate, one leg becomes weak and he ends up falling. His appetite has been good. Notes normal p.o. intake. No trouble swallowing. No coughing. No vision changes. No weakness in his arms but comes in to the Emergency Department tonight really unable to hardly even stand, certainly not able to take a step. No arrhythmic problems. No chest pain. It is clear that he has likely had an acute intracerebral event. Brain CT negative for bleed. He will be here over two nights and ultimately will need placement. He will be admitted for further evaluation and treatment.   PAST MEDICAL HISTORY:  1. Mitral valve disorder.  2. Diabetes mellitus, diet controlled.  3. Back pain.   ALLERGIES: None.   MEDICATIONS:  1. Aspirin 81 mg daily.  2. Lotrel 5/20 daily.   SOCIAL HISTORY: Married. Lives at home.   FAMILY HISTORY: Significant for hypertension, diabetes.   REVIEW OF SYSTEMS: As above, otherwise negative.   PHYSICAL EXAMINATION:  VITAL SIGNS: Blood pressure 115/76, pulse 80 regular, weight 105.   HEENT: Bruising right cheek. TMs normal. Oropharynx benign.   NECK: No bruits.   LUNGS: Clear.   HEART: Regular rhythm, 3/6 systolic murmur apex.   ABDOMEN: Soft, nontender.  SKIN: Scrape over the back.   EXTREMITIES: Generally cachectic. Good peripheral pulses. Motor significantly abnormal Romberg. Significant dysmetria and ataxia. No focal deficits of his arms. Handgrip reasonably symmetric.   ASSESSMENT AND PLAN:  1. Acute cerebrovascular  accident-Aspirin daily. Check carotid Doppler's, brain MRI. Get physical therapy involved. Ultimately may need lumbar spine evaluation if MRI were to be normal.   2. Severe heart murmur-Echo pending. Doubt embolic cerebrovascular accident.  3. Diabetes-diet controlled.  4. Overall prognosis is guarded.  ____________________________ Danella PentonMark F. Daizha Anand, MD mfm:cms D: 02/29/2012 20:06:01 ET T: 03/01/2012 06:56:06 ET JOB#: [id number]  cc: Danella PentonMark F. Tahari Clabaugh, MD, <Dictator> Ramona Slinger Sherlene ShamsF Lesli Issa MD ELECTRONICALLY SIGNED 03/01/2012 8:00

## 2014-09-29 NOTE — Discharge Summary (Signed)
PATIENT NAME:  Colon, [name] MR#:  [id number] DATE OF BIRTH:  01-27-1923  DATE OF ADMISSCharletta CousinON:  02/29/2012 DATE OF DISCHARGE:  03/06/2012  DISCHARGE DIAGNOSES:  1. Acute ataxia with falls.  2. Encephalopathy.  3. Left leg pain consisting of osteoarthritis of the knee and likely radiculopathy down the left leg with sciatic pain.   DISCHARGE MEDICATIONS:  1. Aspirin 325 mg daily.  2. Bactroban cream every 8 hours.  3. Omeprazole 20 mg daily.  4. Voltaren gel 4 grams to his left knee q.i.d.  5. Gabapentin 100 mg p.o. b.i.d.  6. Seroquel 25 mg b.i.d. p.r.n. agitation.   HISTORY AND PHYSICAL: Please see detailed History and Physical done on admission. Briefly, the patient is an 81 year old male with frequent falls, ataxia, weakness that is progressive of late.   HOSPITAL COURSE: The patient was admitted with the above,  seen by Neurology. Admitting doctors are without a clear cause being found for the weakness. However, he did seem to have inability to lift his left leg, with a normal MRI of his brain. He had some pain in the upper leg as best I could tell, although he is somewhat confused. He also had pain in the knee and could not bend that well. A couple of days of Voltaren gel has improved his knee mobility and lessened the pain there. Gabapentin was started, seemed to help his upper leg shooting pain down his leg as well at 100 mg daily. We will go to b.i.d. and titrate further if need, though we will be careful with this causing confusion. He did have some confusion, at any rate, in the hospitalization. Risperdal p.r.n. will be changed to Seroquel going forward as above. He had a lumbar spine x-ray that had dextroscoliosis with multiple level degenerative disk, height loss at L1 and L2, and this was not thought to be acute as he was not having back pain at the time, just the sciatica as noted. Lab work-up was relatively unremarkable with a sedimentation rate of 35, B12 level 785, which is well in the  normal range. He had minimally elevated TSH at 5.63 which may be sick euthyroid at this point. He may need to be followed to ensure that it does not worsen down the line. He had an echocardiogram done given murmur and known mitral regurgitation which, in fact, he still had. There is no carotid stenosis seen, and again his MRI of his brain showed chronic involutional changes without evidence of focal acute abnormalities. His benazepril and amlodipine was held given his weakness and relative hypotension. His blood pressures ranged from 121 to 166, average probably in the 140s. Given his age of 81, this was thought to be reasonable. If he bounces up any more, might would  try an ACE inhibitor alone which may help afterload reduce for his mitral regurgitation. At this point, keeping his strength and balance, etc. up seems to be more important. It was thought, since his leg pain was better and he was still quite weak, a period of rehabilitation skilled facility would be helpful for nutrition, physical therapy, etc.   TIME TAKEN: Approximately 35 minutes for discharge tasks today.  ____________________________ Marya AmslerMarshall W. Dareen PianoAnderson, MD mwa:cbb D: 03/06/2012 08:00:00 ET T: 03/06/2012 10:06:40 ET JOB#: [id number]  cc: Marya AmslerMarshall W. Dareen PianoAnderson, MD, <Dictator> Lauro RegulusMARSHALL W Trebor Galdamez MD ELECTRONICALLY SIGNED 03/07/2012 7:38

## 2014-09-29 NOTE — H&P (Signed)
PATIENT NAMCharletta Colon:  Colon, [name] MR#:  [id number] DATE OF BIRTH:  1923/02/06  DATE OF ADMISSION:  03/14/2012  PRIMARY CARE PHYSICIAN: Dr. Einar CrowMarshall Colon   CHIEF COMPLAINT: Cough and fever.   HISTORY OF PRESENT ILLNESS: 81 year old Caucasian male with recent hospitalization 09/19 to 03/06/2012 for acute ataxia and falls and subsequent discharge to an extended care facility presents with one day's duration of cough and fever. The patient apparently was in his usual state of health and today developed mild cough, nonproductive, associated with fevers. The patient notes that he has had chills as well.  EMS was alerted and on route to the Emergency Department he was hypoxemic with oxygen saturation in the 80s and was also hypotensive with systolics in the 80s requiring 500-mL bolus as well as nasal cannula supplementation. In the Emergency Department he was significantly tachycardic, tachypneic, hypoxemic, and hypotensive. Chest x-ray revealed bilateral pneumonia, significantly worse on the right as compared to the left. He is being admitted for HCAP with severe sepsis and concern for septic shock.  He has been resuscitated per early goal-directed therapy and has received 30 mL/kg of volume resuscitation with appropriate blood pressure response. His blood pressure currently is exactly 90/53 and he is mentating well on nasal cannula.   PAST MEDICAL HISTORY:  1. Acute ataxia with falls. 2. Recent encephalopathy.  3. Left leg pain due to osteoarthritis with radiculopathy.  4. Mitral valve disorder.  5. Diabetes mellitus, diet controlled.  6. Back pain.  7. Hypertension.   PAST SURGICAL HISTORY:  Hernia repair over 25 years ago?  SOCIAL HISTORY:  He is married with a son, currently residing in a nursing home.  FAMILY HISTORY:  Significant for hypertension and diabetes.   ALLERGIES: No known drug allergies.   REVIEW OF SYSTEMS: Fever. Denies fatigue, weakness, or weight loss. EYES: Denies blurred  vision or pain. ENT: Denies tinnitus or ear pain. RESPIRATORY: Admits to a nonproductive cough. Denies wheeze, hemoptysis. Admits to some dyspnea, denies painful respirations. CARDIOVASCULAR: Denies chest pain, orthopnea, or edema. GI: Denies nausea, vomiting, diarrhea, or abdominal pain. GU: Denies dysuria. ENDOCRINE: Admits to chills.  Denies increased sweats. INTEGUMENT: Denies skin rashes. MUSCULOSKELETAL: Denies any joint aches or pains or swelling at this time. NEURO: Denies numbness or headaches.  PSYCH: Denies anxiety or depression.   PHYSICAL EXAM:  VITAL SIGNS: Temperature 98.5, heart rate 114, respirations 42, blood pressure is 87/54, sating 90% on nasal cannula.    GENERAL: Elderly appearing male in severe respiratory distress, speaking in 3 to 4-word sentences.   PSYCH: Awake, alert, oriented times three. Judgment intact.   EYES: Extraocular muscles are intact. Pupils equal, round and reactive to light and accommodation. Normal lids. Anicteric sclerae.   ENT: Normal external ears and nares. Posterior pharynx is clear without erythema or exudates.   RESPIRATORY: The patient has diffuse rhonchi. The patient is using accessory muscles of respiration and is speaking in 3 to 4-word sentences.   CARDIOVASCULAR: The patient is tachycardic. No murmurs appreciated. There is no pedal edema. Pulses are equal bilaterally in upper extremities as well as distally.   ABDOMEN: Soft, nontender, and nondistended. No hepatomegaly.    SKIN: The patient is pale. Skin is warm and dry. He does have seborrheic keratoses on his scalp and face.   LYMPH:  There is no cervical or inguinal lymphadenopathy appreciated.   MUSCULOSKELETAL: There is normal tone. No clubbing or cyanosis noted.   LABORATORY DATA: CBC shows WBC count 7.4, hemoglobin 11.4, hematocrit  34.1, platelet count 171. MCV of 100. BMP shows a glucose 112, BUN 21, creatinine of 1.29, sodium 148, potassium 3.5, chloride 111, bicarbonate of  26, calcium 8.3, bilirubin of 1.5, alkaline phosphatase 118, ALT of 29, AST of 43, total protein of 6.3, albumin of 2.2, osmolality of 298, anion gap of 11. INR 1.5, pH is 7.43, pCO2 34, pO2 is 62 on 4 liters nasal cannula. FiO2 is 36%, bicarbonate 22.6. Oxygen saturation is 94.9%, again on 4 liters nasal cannula. Lactic acid is 3.4.   Chest x-ray shows diffuse pneumonia, particularly in the right middle and lower lobes on preliminary read. Official report is pending.   ASSESSMENT AND PLAN: 81 year old gentleman presenting with fever, cough, and chills, found to be hypoxemic, tachypneic, tachycardic, and hypotensive. Chest x-ray consistent with pneumonia. The patient is being admitted with healthcare-associated pneumonia with severe sepsis as evidenced by tachypnea, tachycardia, acute respiratory failure, and hypotension.  1. Healthcare-associated pneumonia with severe sepsis and concerns for septic shock: At this time the patient is started on vancomycin and Zosyn. Agree with empiric antibiotics. Per the ED the patient has been resuscitated with IV fluids. We will continue IV fluids. We will start stress dose steroids with Accu-Cheks, and this can be tapered or stopped over the next 24 to 48 hours. Blood cultures are sent and pending. We will follow up CBC in the a.m.    2. Acute respiratory failure, hypoxic respiratory failure as a result of pneumonia: Continue supplemental oxygen, wean O2 as tolerated. If the patient continues to decline we will discuss with the family regarding the need for intubation- at this time he is a FULL CODE- although at this time he seems to be improving and I hope that we do not get to that point. Continue management as above.  3. Hypernatremia: This is most likely as a result of insensible losses, and hypovolemia.  Resuscitate with normal saline for now and then encourage p.o. free water. Follow basic metabolic panel.  4. Ataxia with falls: Have physical therapy follow up.   5. Hypertension: The patient had been on Lotrel per the History  and Physical dictated on admission 09/19. However, it is not on his discharge medical record from the 25th. At this time we will hold. We will not initiate any antihypertensive medications.  6. Diabetes mellitus: Apparently diet controlled in the outpatient setting. Given the stress-dose steroids, we will continue Accu-Cheks  for now.  7. Anemia.  8. Prophylaxis: Lovenox.   DISPOSITION: The patient is being admitted to the Intensive Care Unit.   CODE STATUS: FULL CODE.   TIME SPENT ON ADMISSION:  Critical care time coordinating care, speaking with family and  nursing was 50 minutes.    ____________________________ Aurther Loft, DO aeo:bjt D: 03/14/2012 07:26:58 ET T: 03/14/2012 10:51:30 ET JOB#: [id number]  cc: Aurther Loft, DO, <Dictator> Marya Amsler. Dareen Piano, MD Arabell Neria E Kitiara Hintze DO ELECTRONICALLY SIGNED 03/17/2012 1:10

## 2014-09-29 NOTE — Consult Note (Signed)
    Comments   Pt now on SBT with PS 7/ PEEP 5. RR 22, TV 460, VSS. Awake, follows commands. spoke with pt's son, Onalee HuaDavid, at bedside and daughter, Darl PikesSusan, by phone. Told them that pt would likely be ready for extubation in AM and would like to assure that they are present. Family will be here ~ 9AM. Explained to Darl PikesSusan that her mother stated in meeting yesterday that she did not think pt would want re-intubation if he fails extubation. Will clarify goals with family in AM prior to extubation.   Electronic Signatures: Sherre Wooton, Harriett SineNancy (MD)  (Signed 10-Oct-13 15:32)  Authored: Palliative Care   Last Updated: 10-Oct-13 15:32 by Mirian Casco, Harriett SineNancy (MD)

## 2014-09-29 NOTE — Consult Note (Signed)
PATIENT NAMEHARDIN, [name] Colon DATE OF BIRTH:  Sep 09, 1922  DATE OF CONSULTATION:  03/02/2012  REFERRING PHYSICIAN:  Einar Crow, MD CONSULTING PHYSICIAN:  Hemang K. Sherryll Burger, MD  REASON FOR CONSULTATION: Concern for ataxia, frequent falls.   HISTORY OF PRESENT ILLNESS: [name] Colon is an 81 year old Caucasian, pleasant gentleman who fell around 02/16/2012 at nighttime when he stood up and his leg got caught and he fell and hit his head to the bed post. He had a fracture of his right orbital plate and had significant black eye and he has this blood down to his right side of the face.   Since then the patient is having significant difficulty with his balance and when he loses his he might fall.   He denied any weakness, numbness, or difficulty with coordination of his hands, but he feels like his legs just do not move as well. He does not have any bowel or bladder issues.  He feels like this might have started after his fall.   The patient had a CT scan of the head done around the fall which did not show any intracranial hemorrhage.   The patient fell again just before the admission, on 02/29/2012, when he was trying to get up from his chair and try to go to the fan and he had sudden pain in his feet and he fell forward.   Family has not noticed any tremors or slowness or other Parkinsonian symptoms.   The patient lives with his wife at home. His wife seems to have some cognitive issues but the patient himself has been cognitively good per wife.   He still takes care of his finances, etc.   PAST MEDICAL HISTORY:  1. Diverticulosis. 2. Cataracts.  3. Hypertension.  4. Diabetes mellitus.  5. Benign prostatic hypertrophy.  6. Osteoporosis.  7. Kyphosis.  8. Scoliosis of the lumbar spine.   PAST SURGICAL HISTORY:  1. Right inguinal hernia repair x2. 2. Excision of basal cell carcinoma on right shoulder, in October 2004.   FAMILY HISTORY: Significant for diabetes and  hypertension.  SOCIAL HISTORY: Significant that he is a nonsmoker and nondrinker. He lives with his wife. He has a Master's degree in religious studies.   PHYSICAL EXAMINATION:   GENERAL: He is a thin, cachectic Caucasian gentleman, very pleasant. He has multiple skin lesions on his scalp. He is bald. He also has protrusion of his spine on his back which has some redness and has a dressing on.   He is very thin.  He has fragile skin.   RESPIRATORY: His lungs were clear to auscultation.   CARDIOVASCULAR: S1 and S2 heart sounds.   NECK: Carotid exam did not reveal any bruit.   VITALS: He had orthostatic blood pressure checked on 03/01/2012 which did not show any sudden drop in the blood pressure or heart rate. With a change in posture, his heart rate did increase from 61 to 82, from lying down to standing position.   MENTAL STATUS:: He was alert and oriented. He said this is 03/02/2012, the first day of fall, Saturday. He knew the current president as Materials engineer. He forgot the previous president Bush, but said the previous president was [place].   His immediate recall was two out of three and delayed recall was zero out of three.   He is able to follow two-step inverted commands.   He was actually able to count the months of the year backward.   His  attention and concentration seems to be appropriate.   On his cranial nerves, his pupils were equal, round, and reactive. Extraocular movements were intact. His face was symmetric. Tongue was midline. Facial sensations were intact. His visual fields seem to be okay. He does have decreased hearing. He does have a black eye on the right side with blood in the subcutaneous tissue of his right cheek and chin.   On his motor examination, he has mild assistive paratonia, but his strength seems to be symmetric.   But, he did have difficulty sitting up in the bed.   On his sensory examination, it was intact to light touch. His deep tendon  reflexes are brisk, +3 in upper and lower extremity reflexes, except his ankle jerk is absent. His toes were mute.   He has arthritic changes in his upper and lower extremities.   I was not able to check his gait as I tried to sit him up he felt very uncomfortable and he said that he has not walked in a couple of days, so he and his wife were very uncomfortable.   RADIOLOGIC STUDIES:  On his CT scan and MRI of the brain, he does have atrophy and mild ventriculomegaly which seems to be appropriate for the atrophy.   He has mild white matter microvascular ischemic changes.   ASSESSMENT AND PLAN:  Recurrent fall likely multifactorial, but I am afraid of postconcussion syndrome effecting mostly his ambulation without effecting significant memory or other typical symptoms.   I did not see any signs or symptoms suggestive of Parkinsonism. He does not have ataxia on his finger-to-nose or heel-to-shin, so I do not think this is a primary cerebellar issue.   Injury to the vermis or the cerebellum cannot be ruled out which can cause truncal ataxia.   He might have cervical myelopathy due to his brisk reflexes. He also has a very severe lumbar scoliosis, but I do not plan to pursue any type of surgical work-up even though he has myelopathy, so I will not order any MRI of the cervical spine.   I believe he will most benefit from physical therapy that I will order.   I would not change any medications. I reviewed his labs which are unremarkable. His vitamin B12 and TSH have been okay.   I talked to the patient and family extensively about ways to prevent fall such as having light available at nighttime and to make sure there is no rug or cords at the edges, etc., and to have places where he can grab bars, etc.  Feel free to contact me with any further questions. I will follow this patient in the hospital with you.  ____________________________ Hemang K. Sherryll BurgerShah, MD hks:slb D: 03/02/2012 18:39:32  ET T: 03/03/2012 11:46:38 ET JOB#: [id number]  cc: Hemang K. Sherryll BurgerShah, MD, <Dictator> Durene CalHEMANG K [facility] MD ELECTRONICALLY SIGNED 03/04/2012 7:42

## 2014-09-29 NOTE — Discharge Summary (Signed)
PATIENT NAMCharletta Cousin:  Colon, [name] Colon MR#:  [id number] DATE OF BIRTH:  December 22, 1922  DATE OF ADMISSION:  03/14/2012 DATE OF DISCHARGE:  03/28/2012  DISCHARGE DIAGNOSES:  1. Respiratory failure.  2. Failure to thrive secondary to above.  3. Knee pain, difficulty walking for months.  4. Worsening weakness and dysphagia.  5. Hypernatremia with inability to correct with hypotonic IV fluids.  6. Pneumonia causing respiratory failure.  7. Encephalopathy secondary to above.  8. Edema, improved.  9. Hypokalemia.  10. Sepsis. 11. Hypotensive on pressors in the Intensive Care Unit originally.  Those were weaned and he is now supporting his blood pressure on his own.   DISCHARGE MEDICATIONS:  Per Hospice home orders and wishes.  We will send a prescription for sublingual morphine to be used p.r.n. Consider continuous scopolamine patch q. 72 h. for increased secretions.  Otherwise, he is n.p.o.   DIET:  N.p.o. as noted.  ACTIVITY:  Bed rest.  To be changed as tolerated if he can safely ambulate, which is doubtful going forward.   HOSPITAL COURSE: The patient was admitted from the skilled facility with respiratory failure, intubated shortly after hospitalization after discussions with the family deemed that they wished this done. He was extubated a week later and made a NO CODE just after extubation. He was not reintubated but was barely arousable throughout the stay afterwards. He was hypernatremic despite one-half and then one-fourth normal saline, at 155 today.  He was unable to take any p.o. throughout. He is on minimal oxygen at 1 to 2 liters at this point, which can be continued p.r.n. Hospice and as wished. His white count was elevated on admission, down at this point.  Overall his prognosis is extremely poor and it is felt by all that he is best served by comfort care at the Hospice center.  TIME SPENT:  It took approximately 35 minutes to do discharge tasks today.      ____________________________ Marya AmslerMarshall W. Dareen PianoAnderson, MD mwa:bjt D: 03/28/2012 07:25:20 ET T: 03/28/2012 12:14:07 ET JOB#: [id number]  cc: Marya AmslerMarshall W. Dareen PianoAnderson, MD, <Dictator> Lauro RegulusMARSHALL W [place] Bearse MD ELECTRONICALLY SIGNED 03/29/2012 8:17

## 2014-09-29 NOTE — Consult Note (Signed)
    Comments   I spoke with pt's wife and son Onalee HuaDavid. Updated them on pt's current medical condition including his swallowing evaluation. I explained that, in the event pt's mental status and inability to take po do not improve, he will not be able to sustain life. We discussed the option of the Hospice Home and wife seemed to accept this. Unfortunately, my past experience with wife is that she does not remember conversations that we have had. Will continue to follow pt and meet with family to clarify goals.   Electronic Signatures: Larnce Schnackenberg, Harriett SineNancy (MD)  (Signed 14-Oct-13 20:45)  Authored: Palliative Care   Last Updated: 14-Oct-13 20:45 by Suhani Stillion, Harriett SineNancy (MD)
